# Patient Record
Sex: Female | Born: 1940 | Race: White | Hispanic: No | Marital: Married | Smoking: Never smoker
Health system: Southern US, Community
[De-identification: ages and names within clinical notes are randomized; demographics above are authoritative.]

---

## 2014-10-28 ENCOUNTER — Encounter (HOSPITAL_COMMUNITY): Payer: Self-pay

## 2014-10-28 ENCOUNTER — Inpatient Hospital Stay (HOSPITAL_COMMUNITY)
Admission: AD | Admit: 2014-10-28 | Discharge: 2014-10-28 | Disposition: A | Discharge status: Home / Self Care | Payer: Medicare HMO | Source: Ambulatory Visit | Attending: Emergency Medicine | Admitting: Emergency Medicine

## 2014-10-28 ENCOUNTER — Inpatient Hospital Stay (HOSPITAL_COMMUNITY): Payer: Medicare HMO

## 2014-10-28 DIAGNOSIS — R002 Palpitations: Secondary | ICD-10-CM | POA: Insufficient documentation

## 2014-10-28 DIAGNOSIS — R0602 Shortness of breath: Secondary | ICD-10-CM | POA: Insufficient documentation

## 2014-10-28 DIAGNOSIS — R202 Paresthesia of skin: Secondary | ICD-10-CM | POA: Insufficient documentation

## 2014-10-28 DIAGNOSIS — R079 Chest pain, unspecified: Secondary | ICD-10-CM | POA: Insufficient documentation

## 2014-10-28 LAB — CBC
HEMATOCRIT: 35.4 % — AB (ref 36.0–46.0)
Hemoglobin: 11.6 g/dL — ABNORMAL LOW (ref 12.0–15.0)
MCH: 31.9 pg (ref 26.0–34.0)
MCHC: 32.8 g/dL (ref 30.0–36.0)
MCV: 97.3 fL (ref 78.0–100.0)
Platelets: 153 10*3/uL (ref 150–400)
RBC: 3.64 MIL/uL — ABNORMAL LOW (ref 3.87–5.11)
RDW: 12.7 % (ref 11.5–15.5)
WBC: 3.8 10*3/uL — AB (ref 4.0–10.5)

## 2014-10-28 LAB — TROPONIN I

## 2014-10-28 LAB — BASIC METABOLIC PANEL
Anion gap: 11 (ref 5–15)
BUN: 15 mg/dL (ref 6–23)
CALCIUM: 8.6 mg/dL (ref 8.4–10.5)
CO2: 25 mEq/L (ref 19–32)
CREATININE: 0.86 mg/dL (ref 0.50–1.10)
Chloride: 106 mEq/L (ref 96–112)
GFR calc Af Amer: 76 mL/min — ABNORMAL LOW (ref >90)
GFR calc non Af Amer: 66 mL/min — ABNORMAL LOW (ref >90)
GLUCOSE: 104 mg/dL — AB (ref 70–99)
Potassium: 4.3 mEq/L (ref 3.7–5.3)
Sodium: 142 mEq/L (ref 137–147)

## 2014-10-28 LAB — I-STAT TROPONIN, ED: Troponin i, poc: 0 ng/mL (ref 0.00–0.08)

## 2014-10-28 MED ORDER — NITROGLYCERIN 0.4 MG SL SUBL: 0.4000 mg | SUBLINGUAL_TABLET | SUBLINGUAL | Status: DC | PRN

## 2014-10-28 MED ORDER — SODIUM CHLORIDE 0.9 % IV SOLN: 999.0000 mL | INTRAVENOUS | Status: DC

## 2014-10-28 MED ORDER — CLOPIDOGREL BISULFATE 75 MG PO TABS: 300.0000 mg | ORAL_TABLET | Freq: Once | ORAL | Status: DC

## 2014-10-28 MED ORDER — ASPIRIN 81 MG PO CHEW
324.0000 mg | CHEWABLE_TABLET | Freq: Once | ORAL | Status: AC
  Administered 2014-10-28: 324 mg via ORAL
  Filled 2014-10-28: qty 4

## 2014-10-28 NOTE — ED Notes (Signed)
Pt transferring [facility], she had two episodes of pain in her left arm, radiating down to her fingers, once at 0130 and once at 0500. Pt [facility]  [facility], 2 Nitro tablets with EMS. Pt also reports she has been experiencing palpitations. NAD on arrival.

## 2014-10-28 NOTE — Discharge Instructions (Signed)

## 2014-10-28 NOTE — MAU Note (Signed)
Woke up with heart palpitations,  Noticed burning down arm, warm feeling, with tingling in fingers and coldness in fingers x 3 tonight. No severe chest pain. Also noticed nausea. Has 30 day heart monitor

## 2014-10-28 NOTE — MAU Provider Note (Signed)
  History     CSN: [id number]  Arrival date and time: 10/28/14 [id number]   First Provider Initiated Contact with Patient 10/28/14 [phone]      Chief Complaint  Patient presents with  . Chest Pain   Patient is a 73 y.o. female presenting with chest pain. The history is provided by the patient. No language interpreter was used.  Chest Pain The pain radiates to the left arm. Primary symptoms include shortness of breath.    Woke up with heart palpitations, Noticed burning down arm, warm feeling, with tingling in fingers and coldness in fingers x 3 tonight. No severe chest pain. Also noticed nausea. Has 30 day heart monitor   No past medical history on file.  No past surgical history on file.  No family history on file.  History  Substance Use Topics  . Smoking status: Not on file  . Smokeless tobacco: Not on file  . Alcohol Use: Not on file    Allergies:  Allergies  Allergen Reactions  . Adhesive [Tape]     No prescriptions prior to admission    Review of Systems  Respiratory: Positive for shortness of breath.   Cardiovascular: Positive for chest pain.   Physical Exam   Blood pressure 142/82, pulse 85, resp. rate 18, SpO2 98 %.  Physical Exam  Nursing note and vitals reviewed. Constitutional: She appears well-developed and well-nourished. No distress.  HENT:  Head: Normocephalic.  Eyes: Pupils are equal, round, and reactive to light.  Neck: Normal range of motion.  Cardiovascular: Normal rate, regular rhythm and normal heart sounds.   Respiratory: Effort normal.    MAU Course  Procedures  Chest pain protocol initiated  EKG showed sinus rhythm with premature atrial complexes with aberrant conduction [facility]  Assessment and Plan  Chest [facility]- [facility]  [facility]SUSAN 10/28/2014, 6:10 AM

## 2014-10-28 NOTE — ED Provider Notes (Signed)
CSN: [id number]     Arrival date & time 10/28/14  4034 History   First MD Initiated Contact with Patient 10/28/14 [phone]     Chief Complaint  Patient presents with  . Chest Pain     (Consider location/radiation/quality/duration/timing/severity/associated sxs/prior Treatment) HPI Comments: Patient presents with chest pain. Actually she has arm pain rather than chest pain. She's currently wearing an event monitor that was put on from a cardiologist associated with [facility] in [facility], Dr. Leeann Must. She is wearing the monitor because she's been having some intermittent palpitations with associated left arm tingling. She had 3 episodes during the night tonight and it was associated with left arm discomfort, tingling and diaphoresis which she's not had in the past. She also had some associated shortness of breath. She had 3 episodes each lasting about 5-10 minutes. She initially went to the [facility] and was transferred here for further evaluation. She was given aspirin and 2 nitroglycerin tablets prior to arrival. She currently denies any symptoms. She denies any cough or chest congestion.  Patient is a 73 y.o. female presenting with chest pain.  Chest Pain Associated symptoms: diaphoresis, nausea and shortness of breath   Associated symptoms: no abdominal pain, no back pain, no cough, no dizziness, no fatigue, no fever, no headache, no numbness, not vomiting and no weakness     History reviewed. No pertinent past medical history. History reviewed. No pertinent past surgical history. History reviewed. No pertinent family history. History  Substance Use Topics  . Smoking status: Never Smoker   . Smokeless tobacco: Not on file  . Alcohol Use: Yes     Comment: Socially   OB History    No data available     Review of Systems  Constitutional: Positive for diaphoresis. Negative for fever, chills and fatigue.  HENT: Negative for congestion, rhinorrhea and sneezing.   Eyes:  Negative.   Respiratory: Positive for shortness of breath. Negative for cough and chest tightness.   Cardiovascular: Positive for chest pain. Negative for leg swelling.  Gastrointestinal: Positive for nausea. Negative for vomiting, abdominal pain, diarrhea and blood in stool.  Genitourinary: Negative for frequency, hematuria, flank pain and difficulty urinating.  Musculoskeletal: Negative for back pain and arthralgias.  Skin: Negative for rash.  Neurological: Negative for dizziness, speech difficulty, weakness, numbness and headaches.      Allergies  Adhesive and Morphine and related  Home Medications   Prior to Admission medications   Medication Sig Start Date End Date Taking? Authorizing Provider  calcium carbonate (OS-CAL - DOSED IN MG OF ELEMENTAL CALCIUM) 1250 MG tablet Take 1 tablet by mouth daily with breakfast.   Yes Historical Provider, MD  Cyanocobalamin (VITAMIN B 12 PO) Take 1 tablet by mouth daily.   Yes Historical Provider, MD  ferrous sulfate 325 (65 FE) MG tablet Take 325 mg by mouth daily with breakfast.   Yes Historical Provider, MD  fluticasone (FLONASE) 50 MCG/ACT nasal spray Place 1 spray into both nostrils as needed for allergies or rhinitis.   Yes Historical Provider, MD  Multiple Vitamins-Minerals (MULTIVITAMIN WITH MINERALS) tablet Take 1 tablet by mouth daily.   Yes Historical Provider, MD  Potassium (POTASSIMIN PO) Take 1 tablet by mouth daily.   Yes Historical Provider, MD   BP 110/60 mmHg  Pulse 61  Temp(Src) 97.9 F (36.6 C) (Oral)  Resp 15  SpO2 100% Physical Exam  Constitutional: She is oriented to person, place, and time. She appears well-developed and well-nourished.  HENT:  Head: Normocephalic and atraumatic.  Eyes: Pupils are equal, round, and reactive to light.  Neck: Normal range of motion. Neck supple.  Cardiovascular: Normal rate, regular rhythm and normal heart sounds.   Pulmonary/Chest: Effort normal and breath sounds normal. No  respiratory distress. She has no wheezes. She has no rales. She exhibits no tenderness.  Abdominal: Soft. Bowel sounds are normal. There is no tenderness. There is no rebound and no guarding.  Musculoskeletal: Normal range of motion. She exhibits no edema.  Lymphadenopathy:    She has no cervical adenopathy.  Neurological: She is alert and oriented to person, place, and time.  Skin: Skin is warm and dry. No rash noted.  Psychiatric: She has a normal mood and affect.    ED Course  Procedures (including critical care time) Labs Review Results for orders placed or performed during the hospital encounter of 10/28/14  Basic metabolic panel  Result Value Ref Range   Sodium 142 137 - 147 mEq/L   Potassium 4.3 3.7 - 5.3 mEq/L   Chloride 106 96 - 112 mEq/L   CO2 25 19 - 32 mEq/L   Glucose, Bld 104 (H) 70 - 99 mg/dL   BUN 15 6 - 23 mg/dL   Creatinine, Ser 1.610.86 0.50 - 1.10 mg/dL   Calcium 8.6 8.4 - 09.610.5 mg/dL   GFR calc non Af Amer 66 (L) >90 mL/min   GFR calc Af Amer 76 (L) >90 mL/min   Anion gap 11 5 - 15  CBC  Result Value Ref Range   WBC 3.8 (L) 4.0 - 10.5 K/uL   RBC 3.64 (L) 3.87 - 5.11 MIL/uL   Hemoglobin 11.6 (L) 12.0 - 15.0 g/dL   HCT 04.535.4 (L) 40.936.0 - 81.146.0 %   MCV 97.3 78.0 - 100.0 fL   MCH 31.9 26.0 - 34.0 pg   MCHC 32.8 30.0 - 36.0 g/dL   RDW 91.412.7 78.211.5 - 95.615.5 %   Platelets 153 150 - 400 K/uL  Troponin I  Result Value Ref Range   Troponin I <0.30 <0.30 ng/mL  I-stat troponin, ED  Result Value Ref Range   Troponin i, poc 0.00 0.00 - 0.08 ng/mL   Comment 3           Dg Chest Port 1 View  10/28/2014   CLINICAL DATA:  Chest pain and burning sensation down the left arm intermittently for the past month.  EXAM: PORTABLE CHEST - 1 VIEW  COMPARISON:  None.  FINDINGS: Normal sized heart. Clear lungs with normal vascularity. Lower thoracic spine degenerative changes and mild scoliosis.  IMPRESSION: No acute abnormality.   Electronically Signed   By: Gordan PaymentSteve  Reid M.D.   On:  10/28/2014 07:35      Imaging Review Dg Chest Port 1 View  10/28/2014   CLINICAL DATA:  Chest pain and burning sensation down the left arm intermittently for the past month.  EXAM: PORTABLE CHEST - 1 VIEW  COMPARISON:  None.  FINDINGS: Normal sized heart. Clear lungs with normal vascularity. Lower thoracic spine degenerative changes and mild scoliosis.  IMPRESSION: No acute abnormality.   Electronically Signed   By: Gordan PaymentSteve  Reid M.D.   On: 10/28/2014 07:35     EKG Interpretation   Date/Time:  Friday October 28 2014 07:11:46 EST Ventricular Rate:  72 PR Interval:  180 QRS Duration: 90 QT Interval:  396 QTC Calculation: 433 R Axis:   31 Text Interpretation:  Sinus rhythm similar to EKG from same day Confirmed  by  Rayshard Schirtzinger  MD, Seri Kimmer [phone]) on 10/28/2014 7:17:16 AM      MDM   Final diagnoses:  Palpitations    Pt present with palpitations associated with left arm discomfort, diaphoresis, nausea, SOB.  I spoke with on call cardiologist [facility] [place], Dr. Raquel SarnaPayvar, who recommends second troponin and if negative, pt can be discharged with close f/u in their office.  He is unable to access the event monitor, but says that if their is an abnormal rhythm, the company will call the pt.  Pt has negative serial troponins.  Negative serial EKGs.  No symptoms since arrival to ED. Will d/c.  Advised to f/u with her cardiologist on Monday or return if her symptoms worsen over the weekend.  Rolan BuccoMelanie Charlotte Fidalgo, MD 10/28/14 1120

## 2016-06-26 IMAGING — CR DG CHEST 1V PORT
1 series · 1 of 1 positions shown · non-contrast
Comparison: None.

CLINICAL DATA: Chest pain and burning sensation down the left arm
intermittently for the past month.

EXAM:
PORTABLE CHEST - 1 VIEW

[AP]
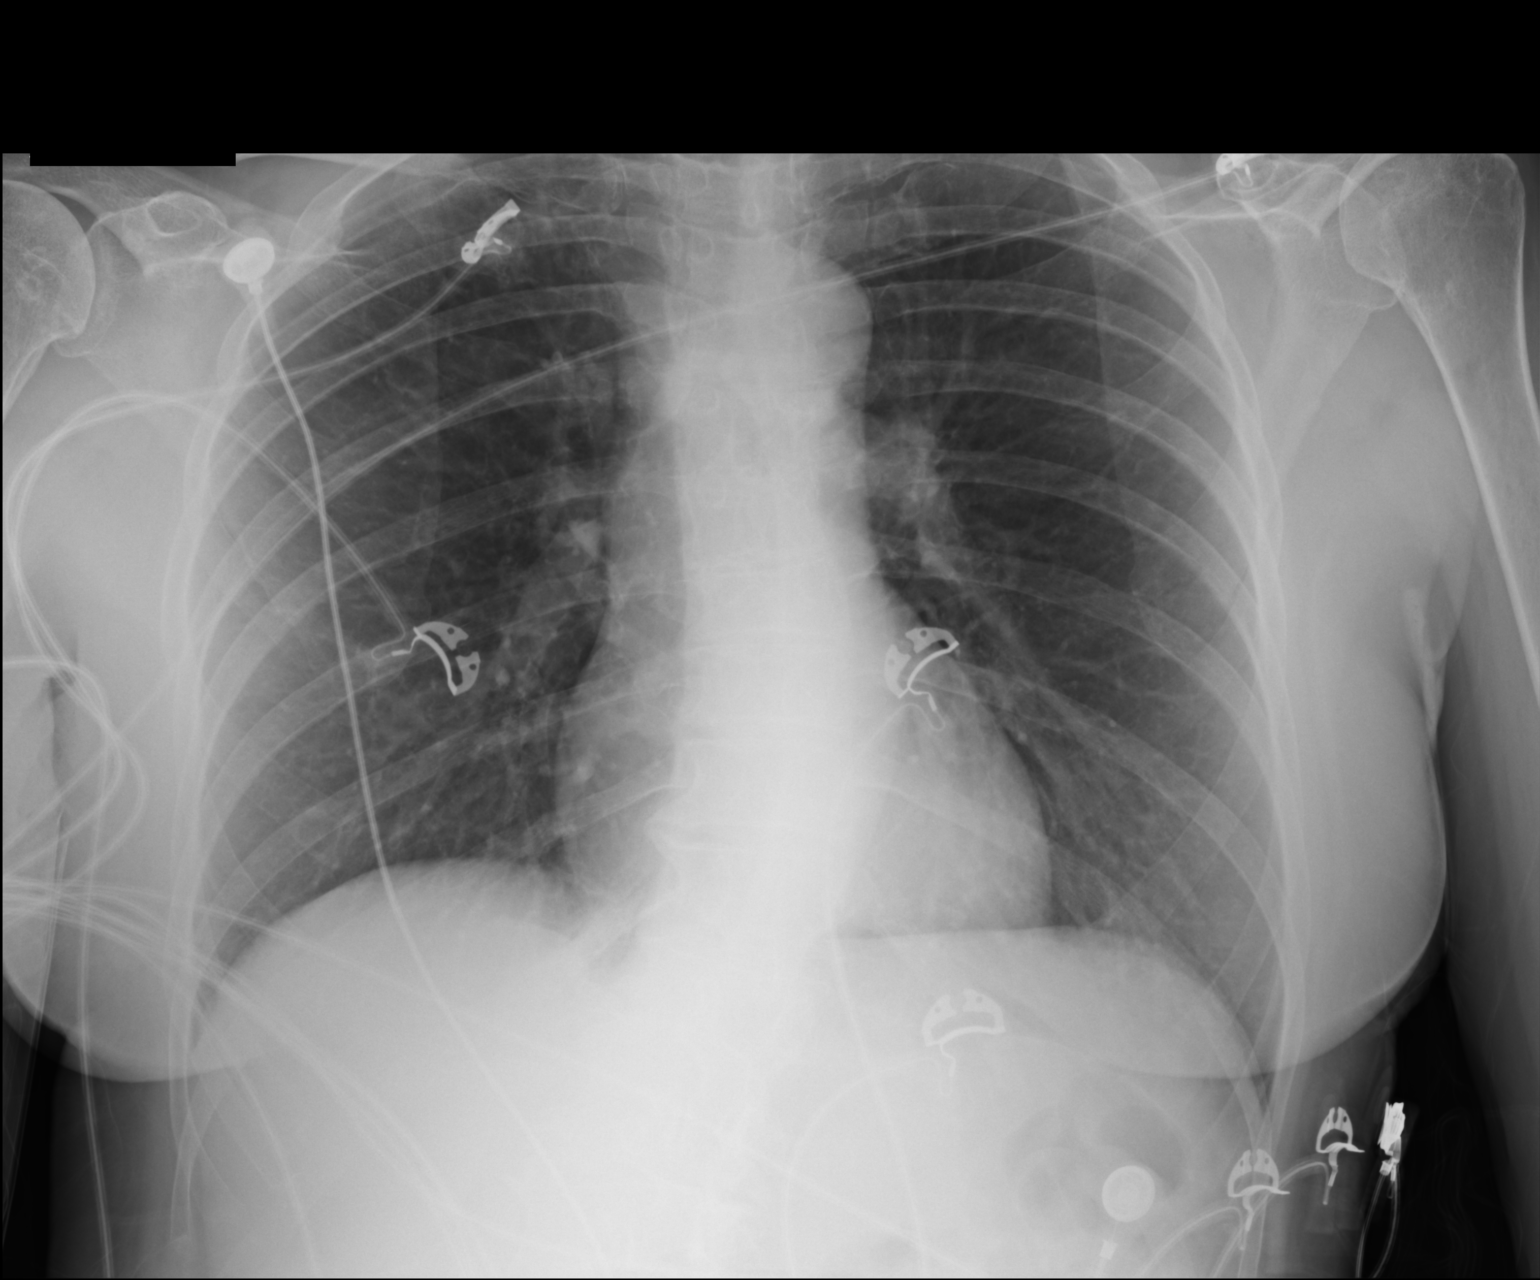

[1 of 1 positions shown; findings below may reference images not displayed]

FINDINGS: Normal sized heart. Clear lungs with normal vascularity. Lower
thoracic spine degenerative changes and mild scoliosis.
IMPRESSION: No acute abnormality.
